# Patient Record
Sex: Female | Born: 1935 | Race: White | Hispanic: No | Smoking: Never smoker
Health system: Southern US, Community
[De-identification: ages and names within clinical notes are randomized; demographics above are authoritative.]

## PROBLEM LIST (undated history)

## (undated) DIAGNOSIS — I10 Essential (primary) hypertension: Secondary | ICD-10-CM

## (undated) DIAGNOSIS — F0390 Unspecified dementia without behavioral disturbance: Secondary | ICD-10-CM

## (undated) DIAGNOSIS — C439 Malignant melanoma of skin, unspecified: Secondary | ICD-10-CM

## (undated) DIAGNOSIS — G43909 Migraine, unspecified, not intractable, without status migrainosus: Secondary | ICD-10-CM

## (undated) HISTORY — PX: GALLBLADDER SURGERY: SHX652

## (undated) HISTORY — PX: CORONARY ARTERY BYPASS GRAFT: SHX141

---

## 2019-09-18 ENCOUNTER — Emergency Department (HOSPITAL_BASED_OUTPATIENT_CLINIC_OR_DEPARTMENT_OTHER): Payer: Medicare Other

## 2019-09-18 ENCOUNTER — Encounter (HOSPITAL_BASED_OUTPATIENT_CLINIC_OR_DEPARTMENT_OTHER): Payer: Self-pay | Admitting: *Deleted

## 2019-09-18 ENCOUNTER — Emergency Department (HOSPITAL_BASED_OUTPATIENT_CLINIC_OR_DEPARTMENT_OTHER)
Admission: EM | Admit: 2019-09-18 | Discharge: 2019-09-18 | Disposition: A | Discharge status: Home / Self Care | Payer: Medicare Other | Attending: Emergency Medicine | Admitting: Emergency Medicine

## 2019-09-18 ENCOUNTER — Other Ambulatory Visit: Payer: Self-pay

## 2019-09-18 DIAGNOSIS — F17210 Nicotine dependence, cigarettes, uncomplicated: Secondary | ICD-10-CM | POA: Diagnosis not present

## 2019-09-18 DIAGNOSIS — F0390 Unspecified dementia without behavioral disturbance: Secondary | ICD-10-CM | POA: Insufficient documentation

## 2019-09-18 DIAGNOSIS — W19XXXA Unspecified fall, initial encounter: Secondary | ICD-10-CM

## 2019-09-18 DIAGNOSIS — S81811A Laceration without foreign body, right lower leg, initial encounter: Secondary | ICD-10-CM

## 2019-09-18 DIAGNOSIS — T360X5A Adverse effect of penicillins, initial encounter: Secondary | ICD-10-CM | POA: Insufficient documentation

## 2019-09-18 DIAGNOSIS — S62354A Nondisplaced fracture of shaft of fourth metacarpal bone, right hand, initial encounter for closed fracture: Secondary | ICD-10-CM

## 2019-09-18 DIAGNOSIS — I10 Essential (primary) hypertension: Secondary | ICD-10-CM

## 2019-09-18 DIAGNOSIS — T887XXA Unspecified adverse effect of drug or medicament, initial encounter: Secondary | ICD-10-CM | POA: Diagnosis not present

## 2019-09-18 DIAGNOSIS — S62326A Displaced fracture of shaft of fifth metacarpal bone, right hand, initial encounter for closed fracture: Secondary | ICD-10-CM

## 2019-09-18 DIAGNOSIS — F419 Anxiety disorder, unspecified: Secondary | ICD-10-CM | POA: Diagnosis present

## 2019-09-18 DIAGNOSIS — S51011A Laceration without foreign body of right elbow, initial encounter: Secondary | ICD-10-CM

## 2019-09-18 HISTORY — DX: Essential (primary) hypertension: I10

## 2019-09-18 HISTORY — DX: Unspecified dementia, unspecified severity, without behavioral disturbance, psychotic disturbance, mood disturbance, and anxiety: F03.90

## 2019-09-18 HISTORY — DX: Malignant melanoma of skin, unspecified: C43.9

## 2019-09-18 HISTORY — DX: Migraine, unspecified, not intractable, without status migrainosus: G43.909

## 2019-09-18 MED ORDER — ACETAMINOPHEN 325 MG PO TABS
650.0000 mg | ORAL_TABLET | Freq: Once | ORAL | Status: AC
  Administered 2019-09-18: 650 mg via ORAL
  Filled 2019-09-18: qty 2

## 2019-09-18 NOTE — Discharge Instructions (Signed)
You were seen in the emergency department for evaluation of injuries from a fall.  You had a CAT scan of your head along with x-rays of your right elbow hand and right lower leg.  Your skin tears were partially repaired with Steri-Strips.  You can use soap and water to your wounds.  Your hand x-ray showed 2 bone fractures and you were placed in a splint.  Please contact Dr. Claudia Desanctis for follow-up.  You may take Tylenol as needed for pain.  Return to the emergency department if any worsening or concerning symptoms.

## 2019-09-18 NOTE — ED Provider Notes (Signed)
[facility] EMERGENCY DEPARTMENT Provider Note   CSN: [id number] Arrival date & time: 09/18/19  1013     History Chief Complaint  Patient presents with  . Fall    Alexandra Aguilar is a 86 y.o. female.  Level 5 caveat secondary to dementia.  86 year old female lives alone was walking out to get her newspaper and missed one step fell down on the brick walkway.  No loss of consciousness and no clear head injury.  EMS was called.  Patient and daughter went to urgent care where they updated her tetanus and gave her some Toradol, bandaged her wounds and recommended she come here for evaluation.  She is complaining of some pain to her right hand.  No headache.  No chest pain shortness of breath abdominal pain numbness or weakness.  Has been ambulatory since the fall.  The history is provided by the patient and a relative.  Fall This is a new problem. The current episode started 1 to 2 hours ago. The problem has not changed since onset.Pertinent negatives include no chest pain, no abdominal pain, no headaches and no shortness of breath. The symptoms are aggravated by bending and twisting. The symptoms are relieved by position. She has tried nothing for the symptoms. The treatment provided no relief.       Past Medical History:  Diagnosis Date  . Dementia ([place])   . Hypertension   . Melanoma ([place])    foot   . Migraines     There are no problems to display for this patient.   Past Surgical History:  Procedure Laterality Date  . CORONARY ARTERY BYPASS GRAFT    . GALLBLADDER SURGERY       OB History   No obstetric history on file.     No family history on file.  Social History   Tobacco Use  . Smoking status: Never Smoker  . Smokeless tobacco: Never Used  Substance Use Topics  . Alcohol use: Never  . Drug use: Never    Home Medications Prior to Admission medications   Medication Sig Start Date End Date Taking? Authorizing Provider  amLODipine (NORVASC) 10 MG  tablet Take 10 mg by mouth daily.   Yes [provider]  atorvastatin (LIPITOR) 20 MG tablet Take 20 mg by mouth daily.   Yes [provider]  levothyroxine (SYNTHROID) 100 MCG tablet Take 100 mcg by mouth daily before breakfast.   Yes [provider]  losartan-hydrochlorothiazide (HYZAAR) 50-12.5 MG tablet Take 1 tablet by mouth daily.   Yes [provider]  metoprolol succinate (TOPROL-XL) 50 MG 24 hr tablet Take 50 mg by mouth daily. Take with or immediately following a meal.   Yes [provider]  potassium chloride (KLOR-CON) 10 MEQ tablet Take 10 mEq by mouth 2 (two) times daily.   Yes [provider]    Allergies    Patient has no allergy information on record.  Review of Systems   Review of Systems  Constitutional: Negative for fever.  HENT: Negative for sore throat.   Eyes: Negative for visual disturbance.  Respiratory: Negative for shortness of breath.   Cardiovascular: Negative for chest pain.  Gastrointestinal: Negative for abdominal pain.  Genitourinary: Negative for dysuria.  Musculoskeletal: Negative for neck pain.  Skin: Positive for wound. Negative for rash.  Neurological: Negative for headaches.    Physical Exam Updated Vital Signs BP (!) 176/66 (BP Location: Right Arm)   Pulse 67   Temp 97.7 F (36.5  C) (Oral)   Resp 19   Ht 5\' 3"  (1.6 m)   Wt 59 kg   SpO2 100%   BMI 23.03 kg/m   Physical Exam Vitals and nursing note reviewed.  Constitutional:      General: She is not in acute distress.    Appearance: Normal appearance. She is well-developed.  HENT:     Head: Normocephalic and atraumatic.  Eyes:     Conjunctiva/sclera: Conjunctivae normal.  Cardiovascular:     Rate and Rhythm: Normal rate and regular rhythm.     Heart sounds: No murmur heard.   Pulmonary:     Effort: Pulmonary effort is normal. No respiratory distress.     Breath sounds: Normal breath sounds.  Abdominal:     Palpations:  Abdomen is soft.     Tenderness: There is no abdominal tenderness.  Musculoskeletal:        General: Tenderness and signs of injury present. Normal range of motion.     Cervical back: Neck supple.     Comments: Right upper extremity full range of motion at shoulder and wrist.  She has full range of motion right elbow with some pain with range of motion.  Normal landmarks.  She has a skin tear to her left elbow.  Right hand has got some bruising over her fifth metacarpal.  Mild tenderness.  Full range of motion of digits.  Small abrasion.  Bilateral lower extremities with full range of motion.  Large skin tear over right lateral lower leg.  Distal neurovascular intact.  Skin:    General: Skin is warm and dry.     Capillary Refill: Capillary refill takes less than 2 seconds.  Neurological:     General: No focal deficit present.     Mental Status: She is alert. Mental status is at baseline. She is disoriented.     Sensory: No sensory deficit.     Motor: No weakness.     ED Results / Procedures / Treatments   Labs (all labs ordered are listed, but only abnormal results are displayed) Labs Reviewed - No data to display  EKG None  Radiology DG Elbow Complete Right  Result Date: 09/18/2019 CLINICAL DATA:  Fall and pain. EXAM: RIGHT ELBOW - COMPLETE 3+ VIEW COMPARISON:  None FINDINGS: The bones appear osteopenic. No significant joint effusion. No fractures or dislocation. IMPRESSION: 1. No acute findings. 2. Osteopenia. Electronically Signed   By: Kerby Moors M.D.   On: 09/18/2019 11:20   DG Tibia/Fibula Right  Result Date: 09/18/2019 CLINICAL DATA:  Fall EXAM: RIGHT TIBIA AND FIBULA - 2 VIEW COMPARISON:  None. FINDINGS: Alignment is anatomic. There is no acute fracture. No intrinsic osseous lesion. No joint effusion at the knee. IMPRESSION: No acute fracture. Electronically Signed   By: Macy Mis M.D.   On: 09/18/2019 11:21   CT Head Wo Contrast  Result Date: 09/18/2019 CLINICAL  DATA:  Fall EXAM: CT HEAD WITHOUT CONTRAST TECHNIQUE: Contiguous axial images were obtained from the base of the skull through the vertex without intravenous contrast. COMPARISON:  None. FINDINGS: Brain: There is no acute intracranial hemorrhage, mass effect, or edema. Gray-white differentiation is preserved. There is no extra-axial fluid collection. Prominence of the ventricles and sulci reflects generalized parenchymal volume loss. Patchy and confluent areas of hypoattenuation in the supratentorial white matter are nonspecific but probably reflect moderate chronic microvascular ischemic changes. Vascular: There is atherosclerotic calcification at the skull base. Skull: Calvarium is unremarkable. Sinuses/Orbits: No acute finding. Other:  None. IMPRESSION: No evidence of acute intracranial injury. Electronically Signed   By: Macy Mis M.D.   On: 09/18/2019 11:27   DG Hand Complete Right  Result Date: 09/18/2019 CLINICAL DATA:  Fall, knee pain and hand pain with bruising to fourth and fifth metacarpal area EXAM: RIGHT HAND - COMPLETE 3+ VIEW COMPARISON:  None FINDINGS: Oblique fracture through the fourth and fifth metacarpals with some mild comminution in the fourth metacarpal and mild angulation of the distal fracture fragment of the fifth metacarpal, apex towards the radius. Osteopenia. No definite intra-articular extension of fractures. Overlap on lateral view limiting assessment but without significant anterior or posterior displacement beyond what can be evaluated given overlapping structures on lateral view. IMPRESSION: Fracture of the fourth metacarpal with mild comminution in the mid shaft. Mildly angulated fracture with oblique features in the midportion of the fifth metacarpal. Electronically Signed   By: Zetta Bills M.D.   On: 09/18/2019 11:21    Procedures .Marland KitchenLaceration Repair  Date/Time: 09/18/2019 12:23 PM Performed by: Hayden Rasmussen, MD Authorized by: Hayden Rasmussen, MD    Consent:    Consent obtained:  Verbal   Consent given by:  Patient and guardian   Risks discussed:  Infection, pain, poor cosmetic result and poor wound healing   Alternatives discussed:  No treatment and delayed treatment Anesthesia (see MAR for exact dosages):    Anesthesia method:  None Laceration details:    Location:  Shoulder/arm   Shoulder/arm location:  L elbow   Length (cm):  10 Repair type:    Repair type:  Simple Treatment:    Area cleansed with:  Saline Skin repair:    Repair method:  Steri-Strips   Number of Steri-Strips:  4 Approximation:    Approximation:  Loose Post-procedure details:    Dressing:  Non-adherent dressing   Patient tolerance of procedure:  Tolerated well, no immediate complications .Marland KitchenLaceration Repair  Date/Time: 09/18/2019 12:27 PM Performed by: Hayden Rasmussen, MD Authorized by: Hayden Rasmussen, MD   Consent:    Consent obtained:  Verbal   Consent given by:  Patient   Risks discussed:  Infection, pain, poor cosmetic result, poor wound healing and retained foreign body   Alternatives discussed:  No treatment and delayed treatment Anesthesia (see MAR for exact dosages):    Anesthesia method:  None Laceration details:    Location:  Leg   Leg location:  R lower leg   Length (cm):  15 Repair type:    Repair type:  Simple Treatment:    Area cleansed with:  Saline Skin repair:    Repair method:  Steri-Strips   Number of Steri-Strips:  8 Approximation:    Approximation:  Loose Post-procedure details:    Patient tolerance of procedure:  Tolerated well, no immediate complications   (including critical care time)  Medications Ordered in ED Medications  acetaminophen (TYLENOL) tablet 650 mg (650 mg Oral Given 09/18/19 1253)    ED Course  I have reviewed the triage vital signs and the nursing notes.  Pertinent labs & imaging results that were available during my care of the patient were reviewed by me and considered in my medical  decision making (see chart for details).  Clinical Course as of Sep 18 1926  Wed Sep 18, 2019  1143 Patient's imaging ordered and reviewed by me.  She has 2 metacarpal fractures on her right hand.  Otherwise no acute findings.   [MB]  17 Follow-upDiscussed with Dr. Claudia Desanctis hand consultant  on-call.  He agrees with ulnar gutter splinting and have her follow up with him.   [MB]    Clinical Course User Index [MB] Hayden Rasmussen, MD   MDM Rules/Calculators/A&P                          86 year old female with mechanical fall.  Skin tears right elbow right lower leg.  Differential includes skull fracture, bleed, stroke, fracture, dislocation, soft tissue injury, contusion wounds were cleaned and skin approximated as best as can be with superficial skin tears.  Dressed.  Discussed with hand surgeon regarding metacarpal fractures.  Patient splinted by ED tech, normal CSMs after application.  Does not use a walker so will make nonweightbearing right hand.  Discussed with patient and daughter plan and return instructions. Final Clinical Impression(s) / ED Diagnoses Final diagnoses:  Fall, initial encounter  Skin tear of right elbow without complication, initial encounter  Skin tear of right lower leg without complication, initial encounter  Closed nondisplaced fracture of shaft of fourth metacarpal bone of right hand, initial encounter  Closed displaced fracture of shaft of fifth metacarpal bone of right hand, initial encounter  Essential hypertension    Rx / DC Orders ED Discharge Orders    None       Hayden Rasmussen, MD 09/18/19 1930

## 2019-09-18 NOTE — ED Notes (Signed)
ED Provider at bedside. 

## 2019-09-18 NOTE — ED Notes (Signed)
Pt incontinent of urine. Peri care and new brief placed. Ice pack given.

## 2019-09-18 NOTE — ED Triage Notes (Addendum)
Pt states that she was getting her morning paper when she missed a step causing her to fall on brick stairs. Denies hitting her head. C/o pain to right knee, right hand,  And right arm. States EMS was called and they bandaged her arm. Was seen at urgent care and was told to come to the ED for further evaluation of skin tear to right arm. She was given a Td and toradol for pain at urgent care. Pt denies hitting her head. Hx of dementia. Lives alone. Right elbow and right knee with large skin tear. Swelling and bruising to right hand.

## 2019-09-25 ENCOUNTER — Ambulatory Visit: Payer: Medicare Other | Admitting: Plastic Surgery

## 2021-07-12 IMAGING — CT CT HEAD W/O CM
3 series · 15 of 47 positions shown, 18 images · non-contrast
Comparison: None.

CLINICAL DATA: Fall

EXAM:
CT HEAD WITHOUT CONTRAST
TECHNIQUE: Contiguous axial images were obtained from the base of the skull
through the vertex without intravenous contrast.

[Series 2: head wo · axial · 0.43mm/px · z∈[+870,+994]mm · 9 of 30 slices shown, 12 images]
[im 3/30  brain]
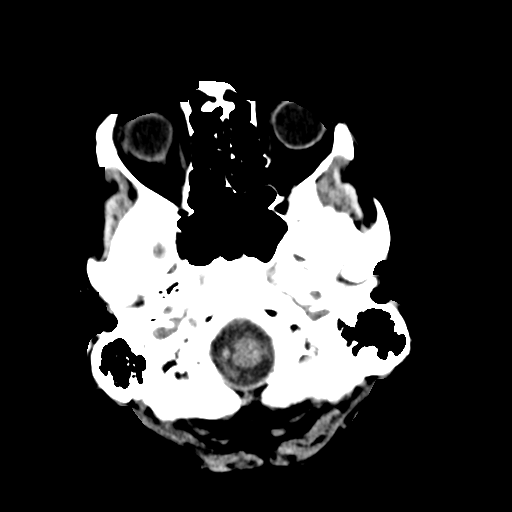
[im 3/30  bone]
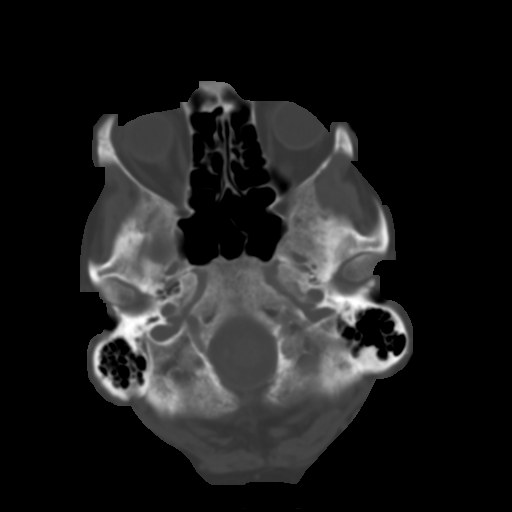
[im 6/30  brain]
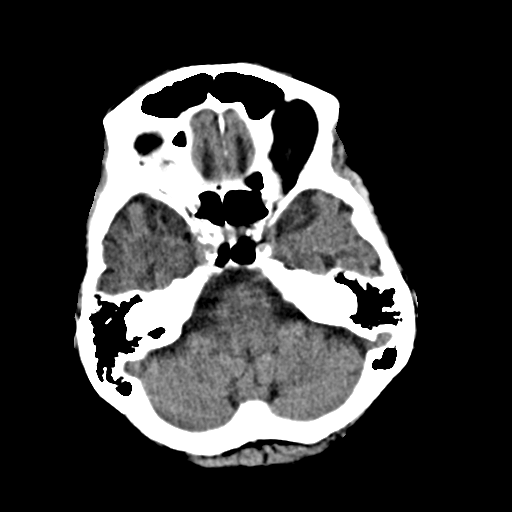
[im 9/30  brain]
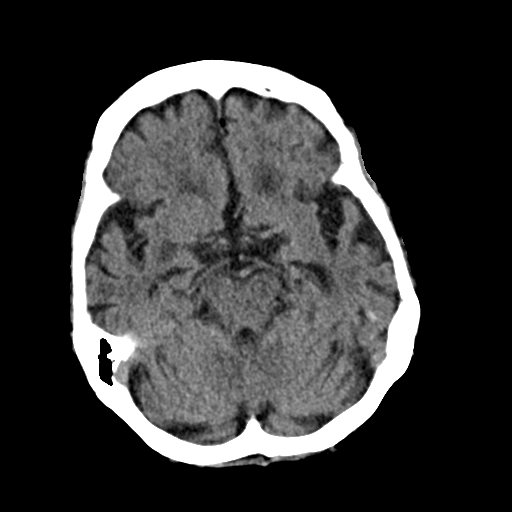
[im 12/30  brain]
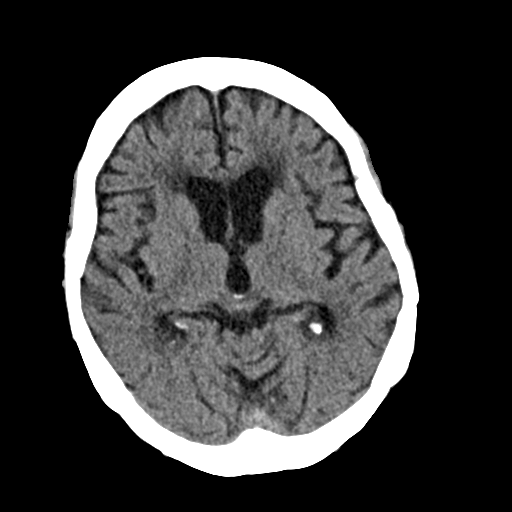
[im 16/30  brain]
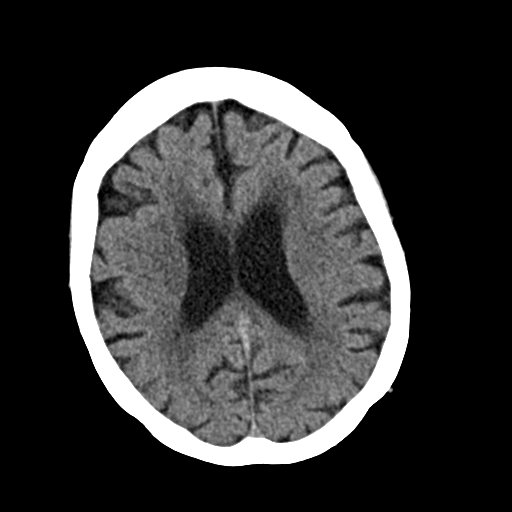
[im 16/30  bone]
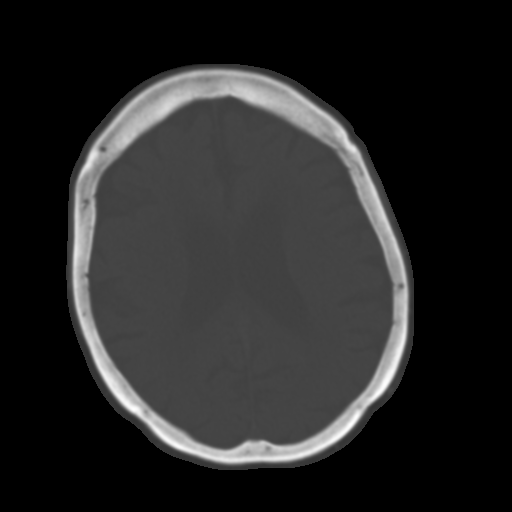
[im 19/30  brain]
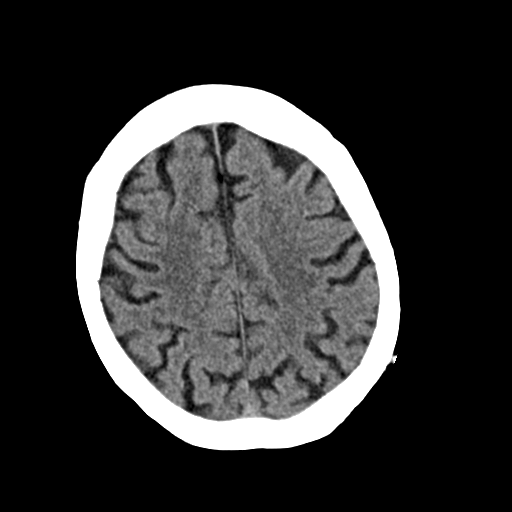
[im 22/30  brain]
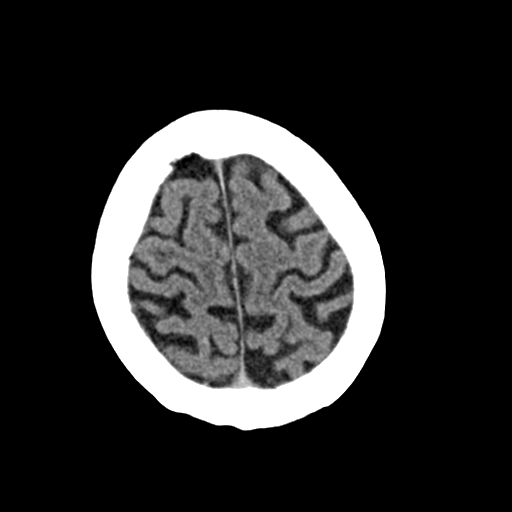
[im 25/30  brain]
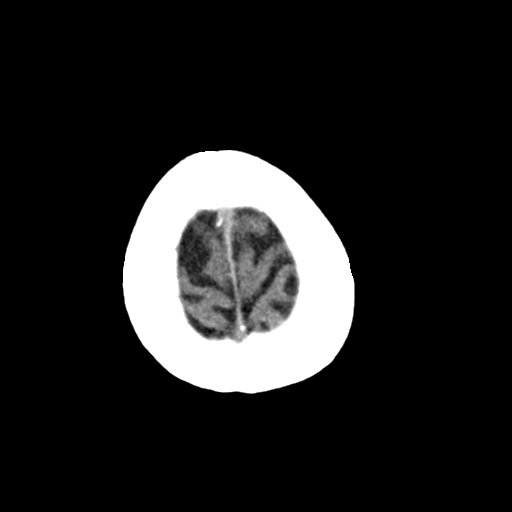
[im 28/30  brain]
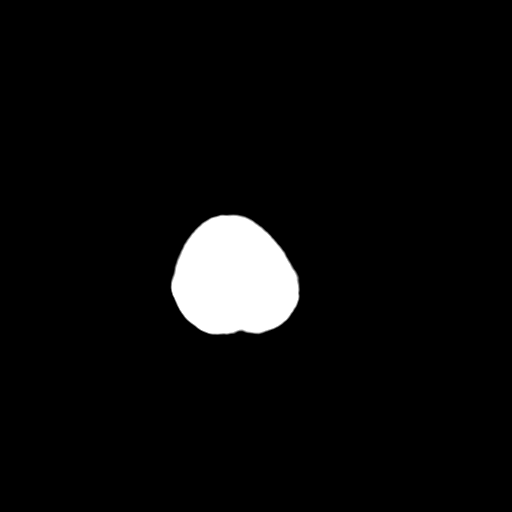
[im 28/30  bone]
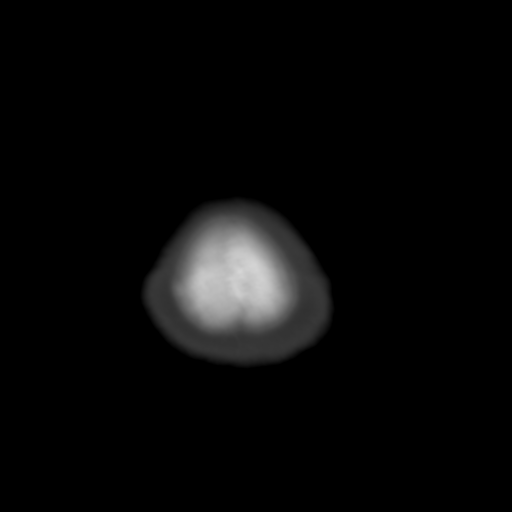

[Series 4: coronal soft · coronal · 0.31mm/px · 3 of 64 slices shown]
[im 22/64  brain]
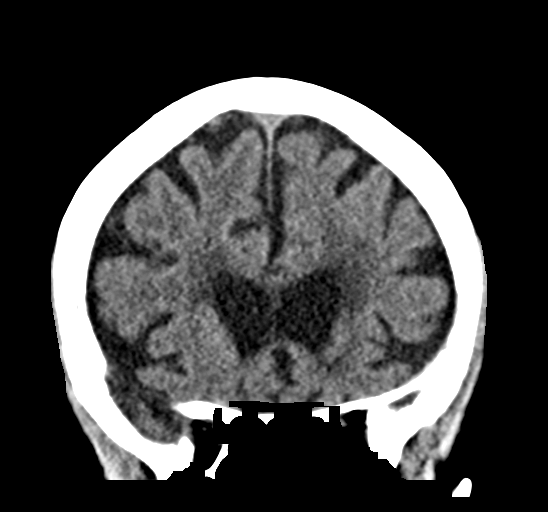
[im 29/64  brain]
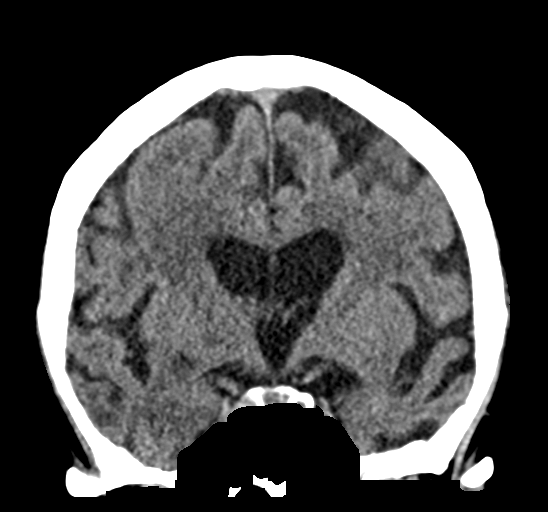
[im 36/64  brain]
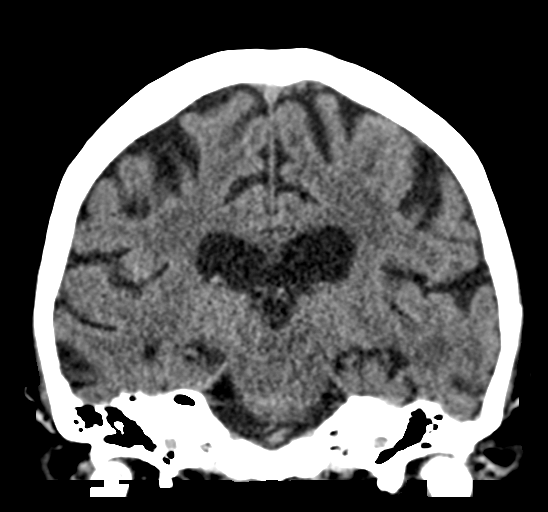

[Series 5: sag soft · sagittal · 0.30mm/px · 3 of 55 slices shown]
[im 19/55  brain]
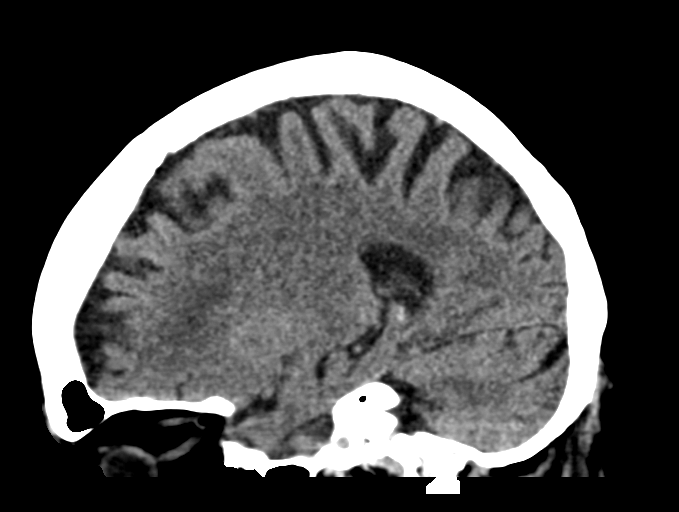
[im 28/55  brain]
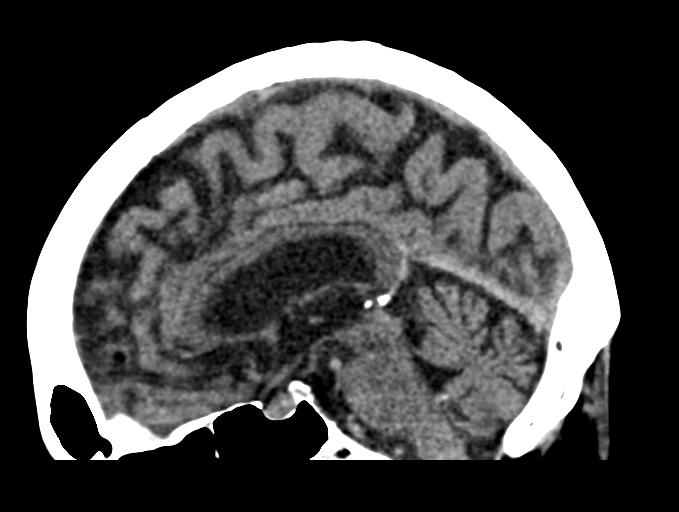
[im 37/55  brain]
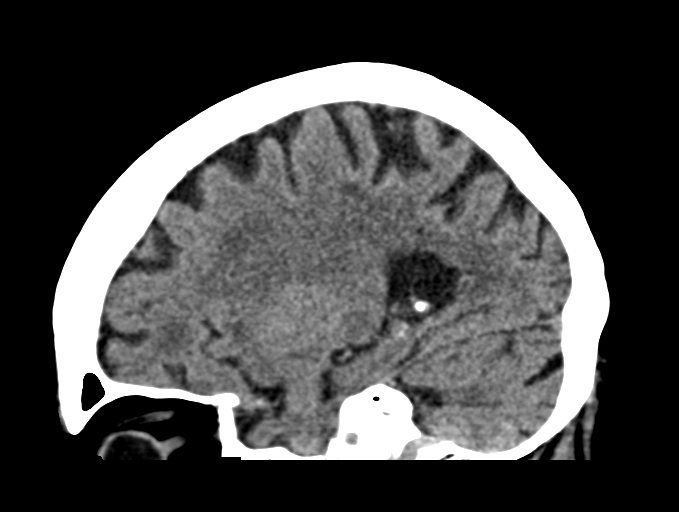

[15 of 47 positions shown; findings below may reference images not displayed]

FINDINGS: Brain: There is no acute intracranial hemorrhage, mass effect, or
edema. Gray-white differentiation is preserved. There is no
extra-axial fluid collection. Prominence of the ventricles and sulci
reflects generalized parenchymal volume loss. Patchy and confluent
areas of hypoattenuation in the supratentorial white matter are
nonspecific but probably reflect moderate chronic microvascular
ischemic changes.

Vascular: There is atherosclerotic calcification at the skull base.

Skull: Calvarium is unremarkable.

Sinuses/Orbits: No acute finding.

Other: None.
IMPRESSION: No evidence of acute intracranial injury.

## 2023-10-20 DEATH — deceased
